# Patient Record
Sex: Female | Born: 1985 | Race: Black or African American | Hispanic: No | Marital: Single
Health system: Southern US, Community
[De-identification: ages and names within clinical notes are randomized; demographics above are authoritative.]

---
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
## 2007-01-23 ENCOUNTER — Emergency Department (HOSPITAL_COMMUNITY): Admission: EM | Admit: 2007-01-23 | Discharge: 2007-01-24 | Payer: Self-pay | Admitting: Emergency Medicine

## 2007-03-10 ENCOUNTER — Inpatient Hospital Stay (HOSPITAL_COMMUNITY): Admission: AD | Admit: 2007-03-10 | Discharge: 2007-03-10 | Payer: Self-pay | Admitting: Obstetrics and Gynecology

## 2007-03-29 ENCOUNTER — Inpatient Hospital Stay (HOSPITAL_COMMUNITY): Admission: AD | Admit: 2007-03-29 | Discharge: 2007-04-01 | Payer: Self-pay | Admitting: Obstetrics and Gynecology

## 2007-04-04 ENCOUNTER — Inpatient Hospital Stay (HOSPITAL_COMMUNITY): Admission: AD | Admit: 2007-04-04 | Discharge: 2007-04-07 | Payer: Self-pay | Admitting: Obstetrics and Gynecology

## 2007-04-05 ENCOUNTER — Encounter (INDEPENDENT_AMBULATORY_CARE_PROVIDER_SITE_OTHER): Payer: Self-pay | Admitting: Obstetrics and Gynecology

## 2007-11-19 ENCOUNTER — Ambulatory Visit (HOSPITAL_COMMUNITY): Admission: RE | Admit: 2007-11-19 | Discharge: 2007-11-19 | Payer: Self-pay | Admitting: Obstetrics and Gynecology

## 2007-12-10 ENCOUNTER — Ambulatory Visit (HOSPITAL_COMMUNITY): Admission: RE | Admit: 2007-12-10 | Discharge: 2007-12-10 | Payer: Self-pay | Admitting: Obstetrics and Gynecology

## 2007-12-16 ENCOUNTER — Inpatient Hospital Stay (HOSPITAL_COMMUNITY): Admission: AD | Admit: 2007-12-16 | Discharge: 2007-12-16 | Payer: Self-pay | Admitting: Obstetrics & Gynecology

## 2007-12-17 ENCOUNTER — Inpatient Hospital Stay (HOSPITAL_COMMUNITY): Admission: AD | Admit: 2007-12-17 | Discharge: 2007-12-17 | Payer: Self-pay | Admitting: Obstetrics

## 2008-01-08 ENCOUNTER — Inpatient Hospital Stay (HOSPITAL_COMMUNITY): Admission: AD | Admit: 2008-01-08 | Discharge: 2008-01-08 | Payer: Self-pay | Admitting: Obstetrics & Gynecology

## 2008-01-18 ENCOUNTER — Inpatient Hospital Stay (HOSPITAL_COMMUNITY): Admission: AD | Admit: 2008-01-18 | Discharge: 2008-01-19 | Payer: Self-pay | Admitting: Obstetrics

## 2008-02-11 ENCOUNTER — Inpatient Hospital Stay (HOSPITAL_COMMUNITY): Admission: AD | Admit: 2008-02-11 | Discharge: 2008-02-11 | Payer: Self-pay | Admitting: Obstetrics

## 2008-03-03 ENCOUNTER — Inpatient Hospital Stay (HOSPITAL_COMMUNITY): Admission: RE | Admit: 2008-03-03 | Discharge: 2008-03-05 | Payer: Self-pay | Admitting: Obstetrics & Gynecology

## 2009-02-02 ENCOUNTER — Ambulatory Visit (HOSPITAL_COMMUNITY): Admission: RE | Admit: 2009-02-02 | Discharge: 2009-02-02 | Payer: Self-pay | Admitting: Obstetrics and Gynecology

## 2010-05-08 ENCOUNTER — Encounter: Payer: Self-pay | Admitting: Obstetrics and Gynecology

## 2010-08-21 IMAGING — US US OB DETAIL+14 WK
1 series · 14 of 28 positions shown · non-contrast
Comparison: none

OBSTETRICAL ULTRASOUND:
 This ultrasound was performed in The [HOSPITAL], and the [facility] report will be stored to [REDACTED] PACS.  This report is also available in [HOSPITAL]?s accessANYware.

[Series 1: us ob detail+14 wk · 14 of 91 slices shown]
[im 4/91]
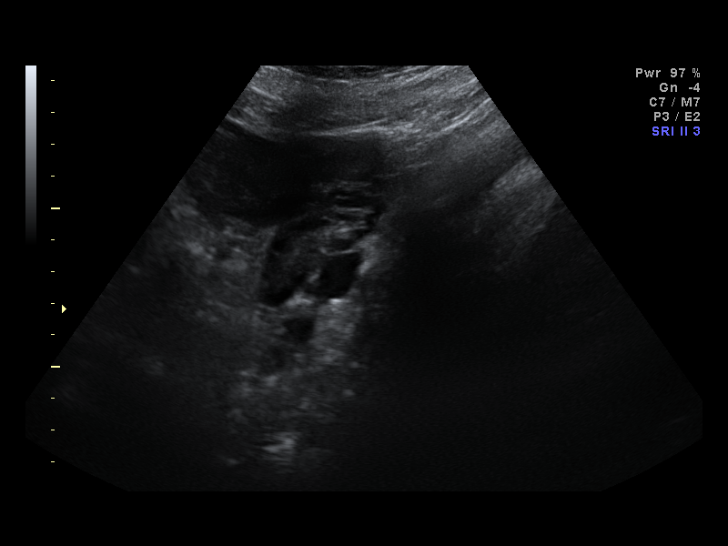
[im 11/91]
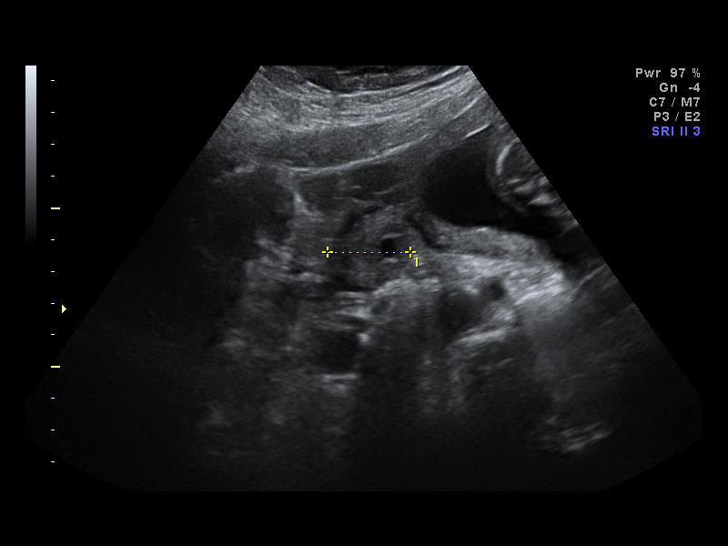
[im 17/91]
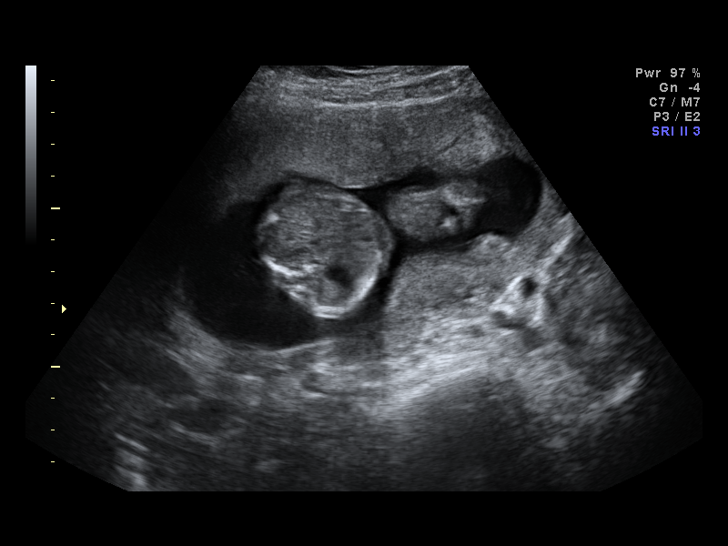
[im 24/91]
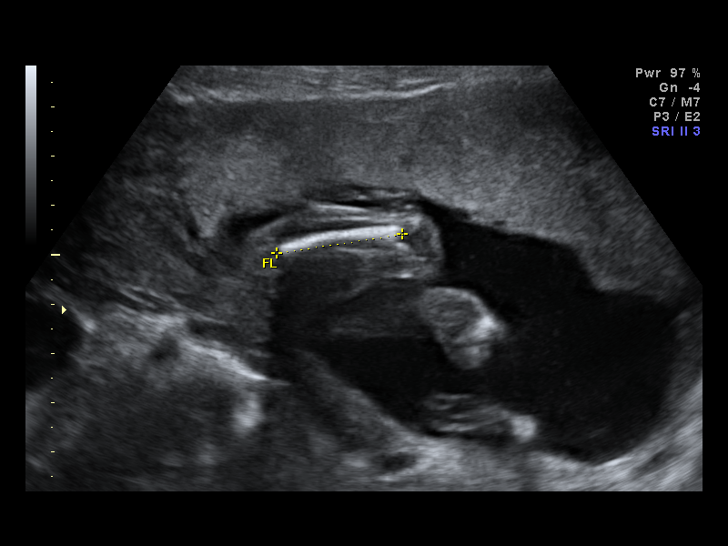
[im 31/91]
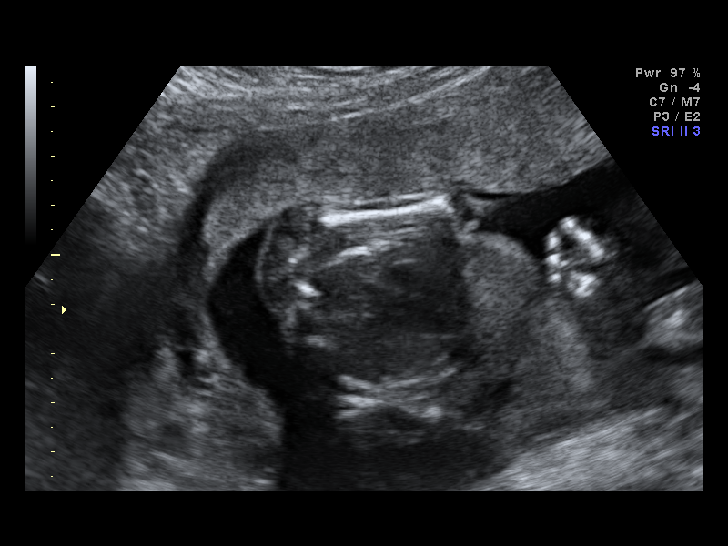
[im 37/91]
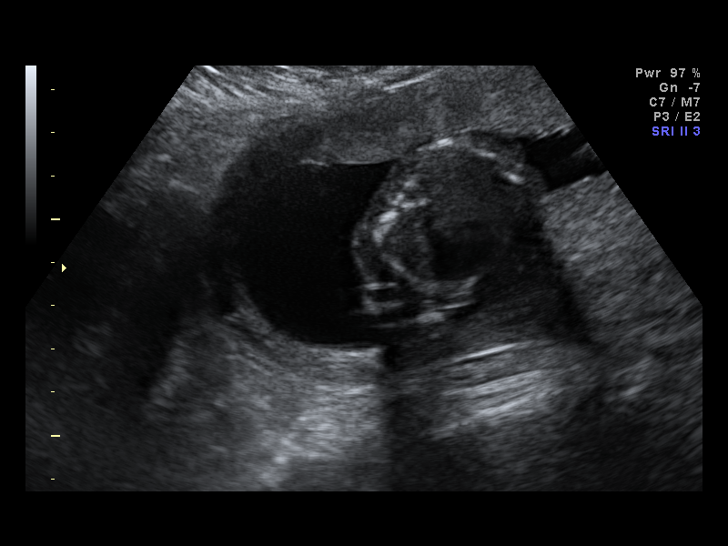
[im 44/91]
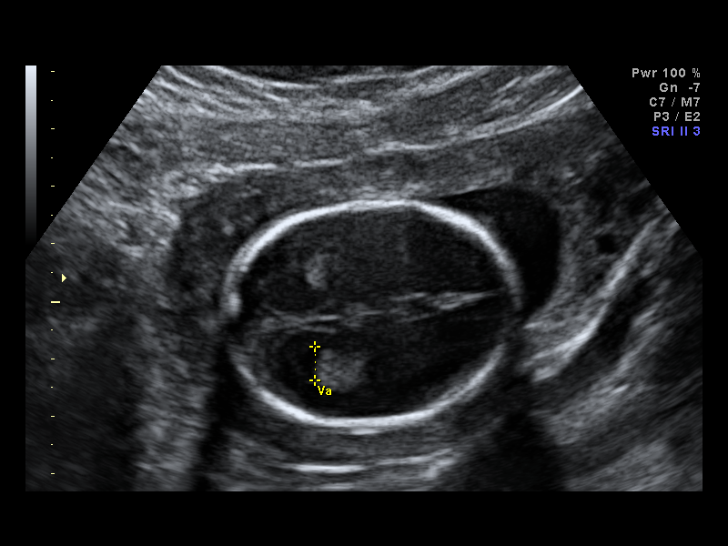
[im 51/91]
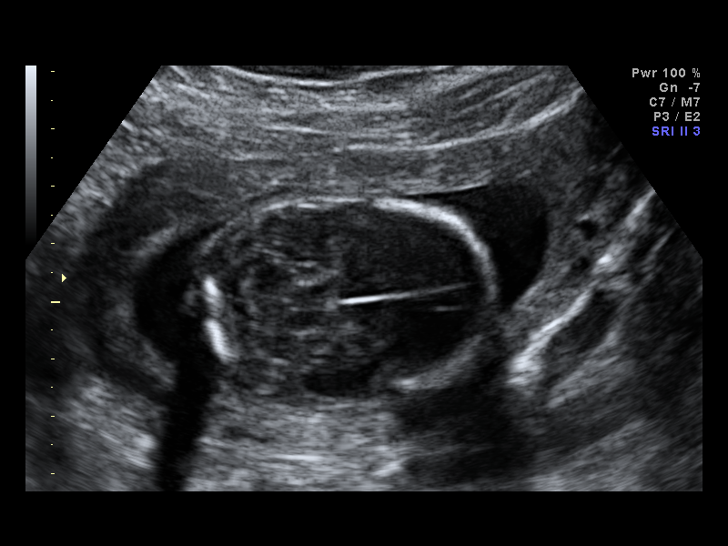
[im 57/91]
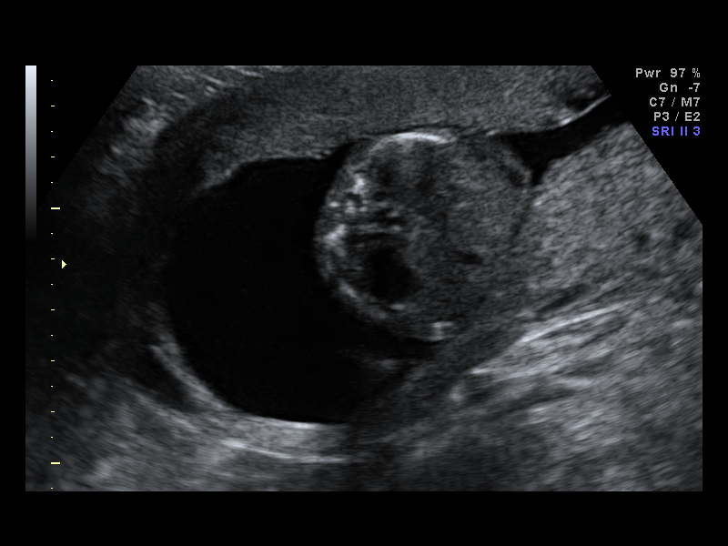
[im 64/91]
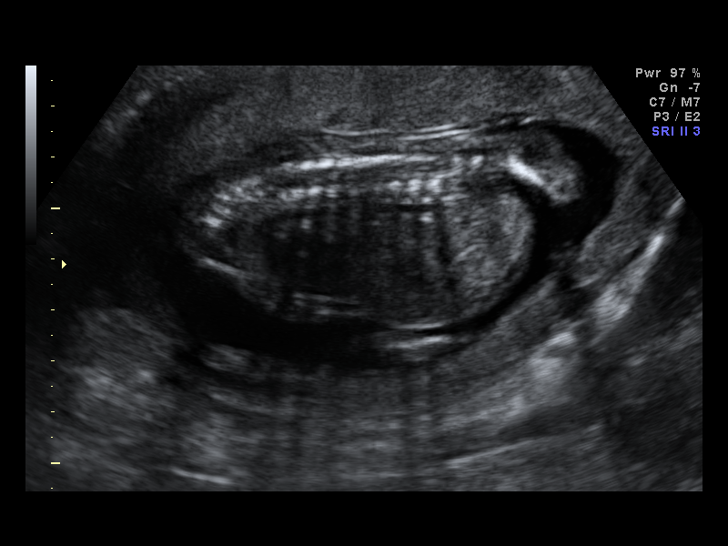
[im 71/91]
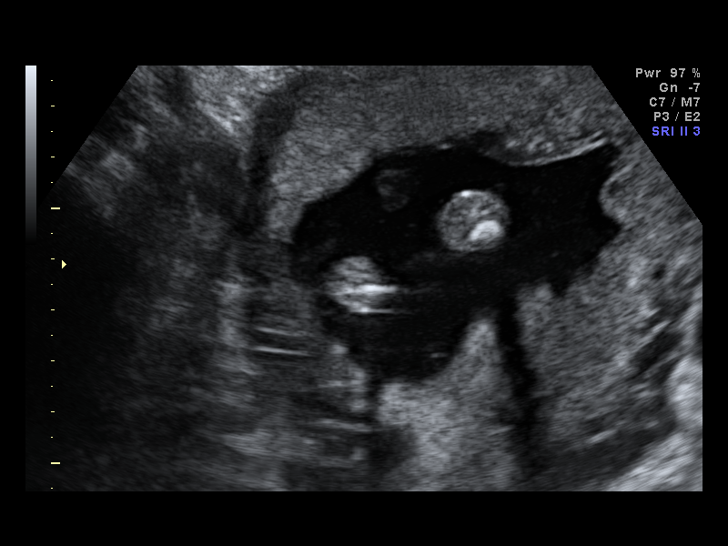
[im 77/91]
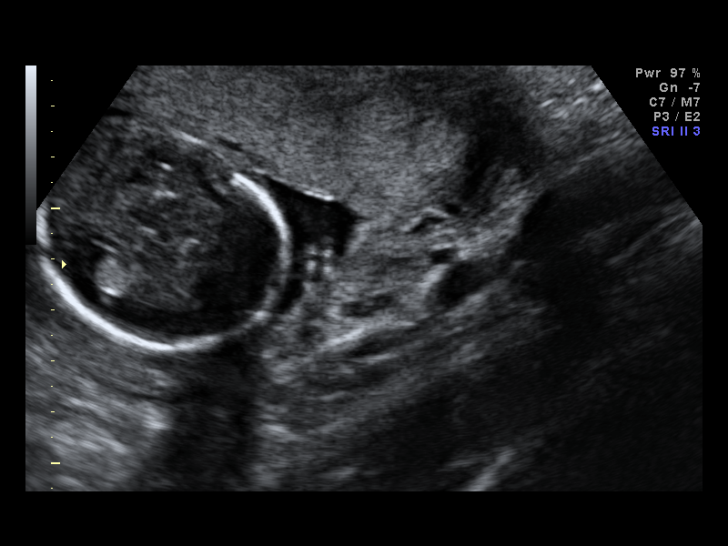
[im 84/91]
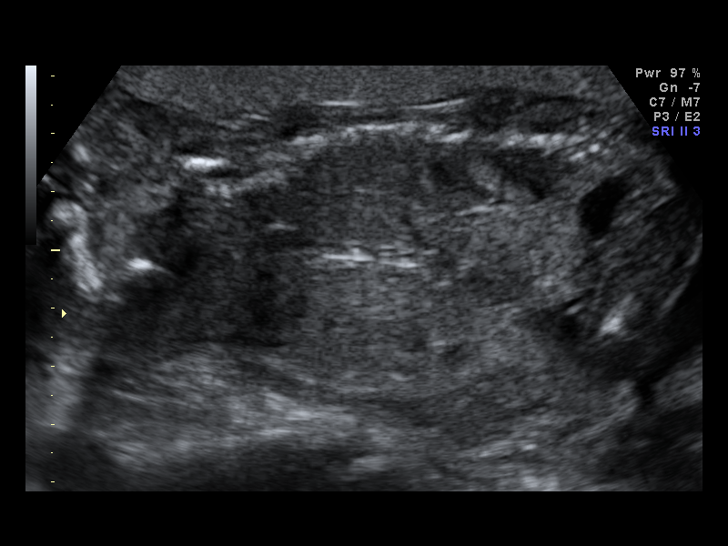
[im 91/91]
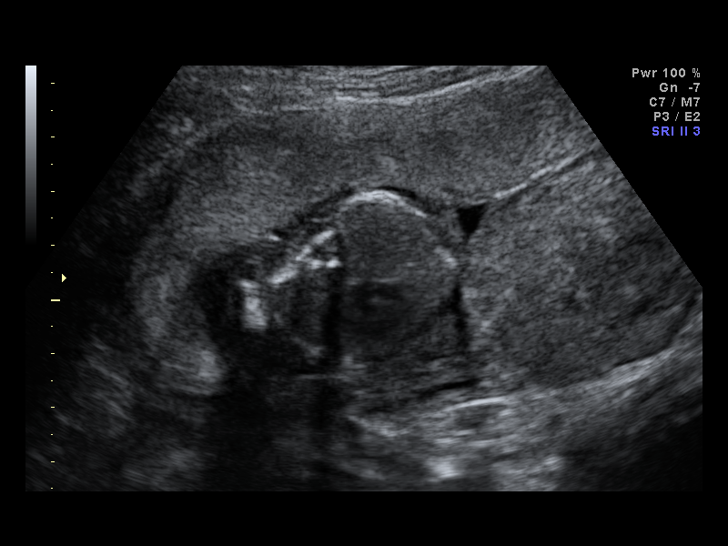

[14 of 28 positions shown; findings below may reference images not displayed]

IMPRESSION: [facility] has also been faxed to the ordering physician.

## 2010-08-30 NOTE — Consult Note (Signed)
Felicia Lopez, Felicia Lopez               ACCOUNT NO.:  [id number]   MEDICAL RECORD NO.:  [id number]          PATIENT TYPE:  INP   LOCATION:  9153                          FACILITY:  [facility]   PHYSICIAN:  Ollen Gross. Vernell Morgans, M.D. DATE OF BIRTH:  1985/05/11   DATE OF CONSULTATION:  03/29/2007  DATE OF DISCHARGE:                                 CONSULTATION   Ms. Offield is a 25 year old black female who initially presented to the  emergency department earlier today with what she was complaining of  contractions.  After coming to the emergency department she was found to  have lower abdominal pain that was worse on the right compared to the  left.  She was also running a fever to 101.5 and had a white count  elevation of 23,600.  The pain she described as just starting today.  She has not had any nausea or vomiting and is actually hungry at this  point.  The pain kind of goes into her low back.   Her other review of systems is unremarkable, except for being [redacted] weeks  pregnant.   PAST MEDICAL HISTORY:  Significant for asthma.   PAST SURGICAL HISTORY:  None.   MEDICATIONS:  Albuterol.   ALLERGIES:  No known drug allergies.   SOCIAL HISTORY:  She denies use of alcohol, tobacco products.   FAMILY HISTORY:  Noncontributory.   PHYSICAL EXAMINATION:  VITAL SIGNS:  Her temp is 101.5, blood pressure  106/53, pulse 119.  GENERAL:  She is a well-developed, well-nourished, black female in no  acute distress.  SKIN:  Warm and dry.  No jaundice.  EYES:  Extraocular muscles are intact.  Pupils are equal, round, and  reactive to light.  Sclerae are nonicteric.  LUNGS:  Clear bilaterally with no use of accessory respiratory muscles.  HEART:  Tachy but regular with an impulse in the left chest.  ABDOMEN:  Soft.  She has mild to moderate tenderness in her lower  abdomen, worse on the right.  No obvious guarding or peritonitis.  She  is [redacted] weeks pregnant.  EXTREMITIES:  No cyanosis, clubbing, or edema.  Good  strength in her  arms and legs.  PSYCHOLOGIC:  She is alert and oriented x3 with no evidence of anxiety  or depression.   Her labs are as listed above.  UA was normal.   ASSESSMENT/PLAN:  This is a 25 year old black female with some lower  abdominal pain, fever, and elevated white count.  This picture is  certainly worrisome for a possible appendicitis, although it is only one  element of the differential.   At this point with the baby fully developed, my recommendation would be  to have her undergo a CT scan of her abdomen and pelvis to rule out  appendicitis before blindly taking her to the operating room.  I have  discussed this with our colleagues in radiology and they agree.  Unfortunately, there are no diagnostic options at this point that do not  involve some risk.  We will discuss this with Dr. Ambrose Mantle and continue to  follow her closely  with you.      Ollen Gross. Vernell Morgans, M.D.  Electronically Signed     PST/MEDQ  D:  03/29/2007  T:  03/31/2007  Job:  [id number]

## 2010-08-30 NOTE — Discharge Summary (Signed)
Felicia Lopez, Felicia Lopez               ACCOUNT NO.:  [id number]   MEDICAL RECORD NO.:  [id number]          PATIENT TYPE:  INP   LOCATION:  9153                          FACILITY:  [facility]   PHYSICIAN:  Malachi Pro. Ambrose Mantle, M.D. DATE OF BIRTH:  1985-09-24   DATE OF ADMISSION:  03/29/2007  DATE OF DISCHARGE:  04/01/2007                               DISCHARGE SUMMARY   This is a  25 year old black female, [redacted] weeks gestation, who presented  to the MAU  with a temperature of 101.6, white count of 23,600 and right  lower abdominal tenderness.  There were also some decelerations of fetal  heart rate with contractions.  The patient was seen in consultation by  Dr. Chevis Pretty from surgery; he suggested doing a CT scan to rule out  appendicitis and giving her antibiotics.  However, I consulted with Dr.  Katrinka Blazing from [facility], and she recommended  admitting to the patient to the hospital and observing her without  antibiotics or CT scan.  With just observation the patient improved.  Her White count did gyrate; it actually dropped from 23,600 to 18,700,  but then rose again to 22,500 before the final check at 15,000.  She was  able to tolerate a liquid diet.  On March 31, 2007 in consultation  with Dr. Carolynne Edouard and on the morning of discharge is afebrile, tolerating a  diet and ambulating well without difficulty; having a reactive nonstress  test.   LABORATORY DATA:  Initial hemoglobin 11.0, hematocrit 32.9, white count  23,600, platelet count 348,000.  Comprehensive metabolic profile was  normal.  Urinalysis was negative, leukocyte esterase negative, urine  nitrite negative.  Follow-up white counts were 18,722, 22,500 and  15,700.   FINAL DIAGNOSES:  1. Intrauterine pregnancy at 36 weeks.  2. Abdominal pain and fever of uncertain etiology.  3. Probable gastroenteritis.   CONSULTATION:  Dr. Chevis Pretty.   The patient is to be seen in our office on April 02, 2007.  She is to  call with any unusual problems.   No medications other than prenatal vitamins are given at discharge.      Malachi Pro. Ambrose Mantle, M.D.  Electronically Signed     TFH/MEDQ  D:  04/01/2007  T:  04/01/2007  Job:  [id number]   cc:   Ollen Gross. Vernell Morgans, M.D.  [address]  [place]  [place] [address]

## 2010-09-02 NOTE — Discharge Summary (Signed)
NAMELUXE, CUADROS NO.:  [id number]   MEDICAL RECORD NO.:  [id number]          PATIENT TYPE:  INP   LOCATION:  9111                          FACILITY:  [facility]   PHYSICIAN:  Huel Cote, M.D. DATE OF BIRTH:  [birth year]   DATE OF ADMISSION:  04/04/2007  DATE OF DISCHARGE:  04/07/2007                               DISCHARGE SUMMARY   DISCHARGE DIAGNOSES:  1. Pregnancy at approximately [redacted] weeks gestation.  2. Positive group B strep status.  3. Status post normal spontaneous vaginal delivery.   DISCHARGE MEDICATIONS:  Motrin 600 mg p.o. over 6 hours.  The patient  plans to get Depo-Provera at her 6-week postpartum exam.   HOSPITAL COURSE:  The patient is a 25 year old G1 P0 who came in with  spontaneous rupture of membranes at 36-6/[redacted] weeks gestation.  Prenatal  care had been uncomplicated except for a slightly increased risk of Down  syndrome in her genetic screening of 1 in 170.  She did have some asthma  flares during her pregnancy which was controlled with medications.   PRENATAL LABS:  B+, antibody negative, sickle normal, RPR nonreactive,  rubella immune, hepatitis B surface antigen negative, HIV negative, GC  negative, chlamydia negative, 1-hour Glucola 113.   She was afebrile with stable vital signs on admission.  Fetal heart rate  was reactive.  Lungs were clear.  Cervix was 2 cm, 70%, minus one  station, and she was placed on penicillin and Pitocin.  She progressed  and reached complete dilation and pushed well with a normal spontaneous  vaginal delivery of a viable female infant 5 pounds 15 ounces.  Apgars  were 8 and 9.  Placenta delivered intact.  She had first-degree  lacerations repaired and one periurethral left laceration repaired with  3-0 Vicryl, 1% lidocaine block was utilized.   She did well postpartum.  Hemoglobin was stable at 10.4. on postpartum  #2 she was tolerating her pain with p.o. medications and was only using  p.r.n. Motrin.   Her bleeding was normal and she was felt stable for  discharge home.      Huel Cote, M.D.  Electronically Signed     KR/MEDQ  D:  04/29/2007  T:  04/30/2007  Job:  [id number]

## 2011-01-16 LAB — URINALYSIS, ROUTINE W REFLEX MICROSCOPIC
Bilirubin Urine: NEGATIVE
Hgb urine dipstick: NEGATIVE
Specific Gravity, Urine: 1.025
Urobilinogen, UA: 0.2
pH: 6

## 2011-01-17 LAB — CBC
HCT: 31.8 — ABNORMAL LOW
Hemoglobin: 10.5 — ABNORMAL LOW
MCHC: 32.7
MCV: 83.6
Platelets: 218
RBC: 3.48 — ABNORMAL LOW
RBC: 3.8 — ABNORMAL LOW
RDW: 15.2
WBC: 13.3 — ABNORMAL HIGH
WBC: 7.8

## 2011-01-18 LAB — URINALYSIS, ROUTINE W REFLEX MICROSCOPIC
Glucose, UA: NEGATIVE
Hgb urine dipstick: NEGATIVE
Ketones, ur: NEGATIVE
Protein, ur: NEGATIVE
Urobilinogen, UA: 0.2

## 2011-01-20 LAB — CBC
Hemoglobin: 10.4 — ABNORMAL LOW
Hemoglobin: 10.9 — ABNORMAL LOW
MCV: 85.3
MCV: 86.3
Platelets: 358
RDW: 14.6
RDW: 14.6
WBC: 14.1 — ABNORMAL HIGH
WBC: 16.5 — ABNORMAL HIGH

## 2011-01-20 LAB — RPR: RPR Ser Ql: NONREACTIVE

## 2011-01-23 LAB — CBC
HCT: 31.1 — ABNORMAL LOW
Hemoglobin: 10.6 — ABNORMAL LOW
MCHC: 33.8
MCHC: 34.2
MCV: 85.4
MCV: 85.6
MCV: 86.1
Platelets: 348
RBC: 3.51 — ABNORMAL LOW
RBC: 3.62 — ABNORMAL LOW
RBC: 3.64 — ABNORMAL LOW
RBC: 3.82 — ABNORMAL LOW
RDW: 14.3
WBC: 18.7 — ABNORMAL HIGH
WBC: 23.6 — ABNORMAL HIGH

## 2011-01-23 LAB — DIFFERENTIAL
Basophils Relative: 0
Eosinophils Absolute: 0 — ABNORMAL LOW
Lymphs Abs: 1.4
Monocytes Absolute: 0.9
Monocytes Relative: 8
Neutro Abs: 15.8 — ABNORMAL HIGH
Neutrophils Relative %: 85 — ABNORMAL HIGH
Neutrophils Relative %: 91 — ABNORMAL HIGH

## 2011-01-23 LAB — URINALYSIS, ROUTINE W REFLEX MICROSCOPIC
Hgb urine dipstick: NEGATIVE
Ketones, ur: 40 — AB
Protein, ur: NEGATIVE
Urobilinogen, UA: 0.2

## 2011-01-23 LAB — URINE CULTURE

## 2011-01-23 LAB — COMPREHENSIVE METABOLIC PANEL
AST: 16
Albumin: 2.6 — ABNORMAL LOW
GFR calc Af Amer: >60
GFR calc non Af Amer: >60
Potassium: 3.6
Total Protein: 6.2

## 2011-01-24 LAB — FETAL FIBRONECTIN: Fetal Fibronectin: NEGATIVE

## 2019-05-14 ENCOUNTER — Ambulatory Visit: Payer: Medicaid Other | Attending: Internal Medicine

## 2019-05-14 DIAGNOSIS — Z20822 Contact with and (suspected) exposure to covid-19: Secondary | ICD-10-CM

## 2019-05-15 LAB — NOVEL CORONAVIRUS, NAA: SARS-CoV-2, NAA: NOT DETECTED

## 2020-10-12 ENCOUNTER — Emergency Department (HOSPITAL_BASED_OUTPATIENT_CLINIC_OR_DEPARTMENT_OTHER)
Admission: EM | Admit: 2020-10-12 | Discharge: 2020-10-12 | Discharge status: Against Medical Advice | Payer: Medicaid Other

## 2020-10-12 ENCOUNTER — Other Ambulatory Visit: Payer: Self-pay

## 2020-10-12 NOTE — ED Notes (Signed)
No answer to triage, volunteer states patient left after registration. Charge RN aware.

## 2021-01-31 ENCOUNTER — Other Ambulatory Visit: Payer: Self-pay

## 2021-01-31 ENCOUNTER — Emergency Department (HOSPITAL_COMMUNITY)
Admission: EM | Admit: 2021-01-31 | Discharge: 2021-01-31 | Discharge status: Against Medical Advice | Payer: Medicaid Other | Attending: Emergency Medicine | Admitting: Emergency Medicine

## 2021-01-31 DIAGNOSIS — S0990XA Unspecified injury of head, initial encounter: Secondary | ICD-10-CM | POA: Insufficient documentation

## 2021-01-31 DIAGNOSIS — M542 Cervicalgia: Secondary | ICD-10-CM | POA: Insufficient documentation

## 2021-01-31 DIAGNOSIS — Z5321 Procedure and treatment not carried out due to patient leaving prior to being seen by health care provider: Secondary | ICD-10-CM | POA: Diagnosis not present
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
## 2021-01-31 NOTE — ED Notes (Signed)
Pt called multiple times no answer 
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
